# Patient Record
Sex: Female | Born: 1935 | Race: White | Hispanic: No | Smoking: Never smoker
Health system: Southern US, Community
[De-identification: ages and names within clinical notes are randomized; demographics above are authoritative.]

## PROBLEM LIST (undated history)

## (undated) DIAGNOSIS — S4291XA Fracture of right shoulder girdle, part unspecified, initial encounter for closed fracture: Secondary | ICD-10-CM

## (undated) DIAGNOSIS — M1990 Unspecified osteoarthritis, unspecified site: Secondary | ICD-10-CM

## (undated) DIAGNOSIS — C801 Malignant (primary) neoplasm, unspecified: Secondary | ICD-10-CM

## (undated) DIAGNOSIS — M810 Age-related osteoporosis without current pathological fracture: Secondary | ICD-10-CM

## (undated) DIAGNOSIS — I10 Essential (primary) hypertension: Secondary | ICD-10-CM

## (undated) DIAGNOSIS — F0390 Unspecified dementia without behavioral disturbance: Secondary | ICD-10-CM

## (undated) HISTORY — PX: ABDOMINAL HYSTERECTOMY: SHX81

## (undated) HISTORY — PX: APPENDECTOMY: SHX54

---

## 2017-04-15 ENCOUNTER — Emergency Department: Payer: Medicare Other

## 2017-04-15 ENCOUNTER — Encounter: Payer: Self-pay | Admitting: Emergency Medicine

## 2017-04-15 ENCOUNTER — Emergency Department
Admission: EM | Admit: 2017-04-15 | Discharge: 2017-04-15 | Disposition: A | Discharge status: Home / Self Care | Payer: Medicare Other | Attending: Emergency Medicine | Admitting: Emergency Medicine

## 2017-04-15 DIAGNOSIS — I10 Essential (primary) hypertension: Secondary | ICD-10-CM | POA: Diagnosis not present

## 2017-04-15 DIAGNOSIS — R42 Dizziness and giddiness: Secondary | ICD-10-CM | POA: Diagnosis not present

## 2017-04-15 HISTORY — DX: Malignant (primary) neoplasm, unspecified: C80.1

## 2017-04-15 HISTORY — DX: Essential (primary) hypertension: I10

## 2017-04-15 HISTORY — DX: Fracture of right shoulder girdle, part unspecified, initial encounter for closed fracture: S42.91XA

## 2017-04-15 HISTORY — DX: Unspecified osteoarthritis, unspecified site: M19.90

## 2017-04-15 HISTORY — DX: Age-related osteoporosis without current pathological fracture: M81.0

## 2017-04-15 HISTORY — DX: Unspecified dementia, unspecified severity, without behavioral disturbance, psychotic disturbance, mood disturbance, and anxiety: F03.90

## 2017-04-15 LAB — BASIC METABOLIC PANEL
ANION GAP: 6 (ref 5–15)
BUN: 19 mg/dL (ref 6–20)
CO2: 27 mmol/L (ref 22–32)
Calcium: 9.4 mg/dL (ref 8.9–10.3)
Chloride: 107 mmol/L (ref 101–111)
Creatinine, Ser: 1.37 mg/dL — ABNORMAL HIGH (ref 0.44–1.00)
GFR calc Af Amer: 39 mL/min — ABNORMAL LOW (ref >60)
GFR, EST NON AFRICAN AMERICAN: 33 mL/min — AB (ref >60)
Glucose, Bld: 90 mg/dL (ref 65–99)
POTASSIUM: 3.6 mmol/L (ref 3.5–5.1)
SODIUM: 140 mmol/L (ref 135–145)

## 2017-04-15 LAB — CBC
HEMATOCRIT: 39.7 % (ref 35.0–47.0)
HEMOGLOBIN: 13.8 g/dL (ref 12.0–16.0)
MCH: 30.1 pg (ref 26.0–34.0)
MCHC: 34.7 g/dL (ref 32.0–36.0)
MCV: 86.6 fL (ref 80.0–100.0)
Platelets: 163 10*3/uL (ref 150–440)
RBC: 4.58 MIL/uL (ref 3.80–5.20)
RDW: 17.6 % — ABNORMAL HIGH (ref 11.5–14.5)
WBC: 6 10*3/uL (ref 3.6–11.0)

## 2017-04-15 LAB — URINALYSIS, COMPLETE (UACMP) WITH MICROSCOPIC
BACTERIA UA: NONE SEEN
Bilirubin Urine: NEGATIVE
GLUCOSE, UA: NEGATIVE mg/dL
HGB URINE DIPSTICK: NEGATIVE
KETONES UR: NEGATIVE mg/dL
LEUKOCYTES UA: NEGATIVE
NITRITE: NEGATIVE
Protein, ur: NEGATIVE mg/dL
Specific Gravity, Urine: 1.025 (ref 1.005–1.030)
pH: 5 (ref 5.0–8.0)

## 2017-04-15 LAB — GLUCOSE, CAPILLARY: Glucose-Capillary: 87 mg/dL (ref 65–99)

## 2017-04-15 LAB — TROPONIN I: Troponin I: <0.03 ng/mL (ref <0.03)

## 2017-04-15 MED ORDER — LOSARTAN POTASSIUM 100 MG PO TABS: 100.0000 mg | ORAL_TABLET | Freq: Every day | ORAL | 11 refills | Status: AC

## 2017-04-15 NOTE — ED Notes (Signed)
Pt and family very concerned regarding wait time for md. Family has been checking pt's blood pressure approx every 30 minutes to one hour today and presented read out to this rn for review. Pt states she has been dizzy for "a long time every morning". Per pt dizziness increased this am. Pt with perrl 30mm and brisk. Pt moves all extremities without difficulty. Warm blankets provided to pt and explanation of delay provided to family.

## 2017-04-15 NOTE — ED Triage Notes (Signed)
Family to stat desk asking how much longer. Family updated on wait time. Patient in no acute distress at this time.

## 2017-04-15 NOTE — ED Notes (Signed)
Patient transported to CT 

## 2017-04-15 NOTE — ED Provider Notes (Signed)
[facility] Emergency Department Provider Note       Time seen: ----------------------------------------- 9:26 PM on 04/15/2017 -----------------------------------------  Level V caveat: History/ROS limited by dementia   I have reviewed the triage vital signs and the nursing notes.   HISTORY   Chief Complaint Hypertension and Dizziness    HPI Michele Simmons is a 83 y.o. female who presents to the ED for elevated blood pressure. patient was transported to here by her son who is from [place]. Patient typically lives in [place] in the path of the [place] currently. Blood pressure was checked at home today multiple times and was very elevated. It is unknown what her normal blood pressure is. She did have some headache and difficulty walking earlier today. She was taking losartan 50 mg daily and received an additional 25 mg was started today around noon. She denies any recent illness or other complaints but has dementia.   Past Medical History:  Diagnosis Date  . Arthritis   . Cancer ([place])    Basal cell skin cancer  . Dementia   . Hypertension   . Osteoporosis   . Shoulder fracture, right    unable to do surgery due to osteoporosis    There are no active problems to display for this patient.   Past Surgical History:  Procedure Laterality Date  . ABDOMINAL HYSTERECTOMY    . APPENDECTOMY      Allergies Penicillins  Social History Social History  Substance Use Topics  . Smoking status: Never Smoker  . Smokeless tobacco: Never Used  . Alcohol use No    Review of Systems Constitutional: Negative for fever. Cardiovascular: Negative for chest pain. Respiratory: Negative for shortness of breath. Gastrointestinal: Negative for abdominal pain, vomiting and diarrhea. Genitourinary: Negative for dysuria. Musculoskeletal: Negative for back pain. Skin: Negative for rash. Neurological: positive for recent headache  All systems  negative/normal/unremarkable except as stated in the HPI  ____________________________________________   PHYSICAL EXAM:  VITAL SIGNS: ED Triage Vitals  Enc Vitals Group     BP 04/15/17 1701 (!) 156/103     Pulse Rate 04/15/17 1701 89     Resp 04/15/17 1701 18     Temp 04/15/17 1701 98.5 F (36.9 C)     Temp Source 04/15/17 1701 Oral     SpO2 04/15/17 1701 97 %     Weight 04/15/17 1702 140 lb (63.5 kg)     Height 04/15/17 1702 5\' 4"  (1.626 m)     Head Circumference --      Peak Flow --      Pain Score 04/15/17 1700 7     Pain Loc --      Pain Edu? --      Excl. in [place]? --     Constitutional: Alert and oriented. Well appearing and in no distress. Eyes: Conjunctivae are normal. Normal extraocular movements. ENT   Head: Normocephalic and atraumatic.   Nose: No congestion/rhinnorhea.   Mouth/Throat: Mucous membranes are moist.   Neck: No stridor. Cardiovascular: Normal rate, regular rhythm. No murmurs, rubs, or gallops. Respiratory: Normal respiratory effort without tachypnea nor retractions. Breath sounds are clear and equal bilaterally. No wheezes/rales/rhonchi. Gastrointestinal: Soft and nontender. Normal bowel sounds Musculoskeletal: Nontender with normal range of motion in extremities. No lower extremity tenderness nor edema. Neurologic:  Normal speech and language. No gross focal neurologic deficits are appreciated. strength, sensation, cranial nerves appear normal. Skin:  Skin is warm, dry and intact. No rash noted. Psychiatric: Mood  and affect are normal. Speech and behavior are normal.  ____________________________________________  EKG: Interpreted by me.normal sinus rhythm with rate 87 bpm, normal PR interval, normal QRS, normal QT.  ____________________________________________  ED COURSE:  Pertinent labs & imaging results that were available during my care of the patient were reviewed by me and considered in my medical decision making (see chart for  details). Patient presents for hypertension, we will assess with labs and imaging as indicated.   Procedures ____________________________________________   LABS (pertinent positives/negatives)  Labs Reviewed  BASIC METABOLIC PANEL - Abnormal; Notable for the following:       Result Value   Creatinine, Ser 1.37 (*)    GFR calc non Af Amer 33 (*)    GFR calc Af Amer 39 (*)    All other components within normal limits  CBC - Abnormal; Notable for the following:    RDW 17.6 (*)    All other components within normal limits  URINALYSIS, COMPLETE (UACMP) WITH MICROSCOPIC - Abnormal; Notable for the following:    Color, Urine YELLOW (*)    APPearance CLEAR (*)    Squamous Epithelial / LPF 0-5 (*)    All other components within normal limits  GLUCOSE, CAPILLARY  TROPONIN I  CBG MONITORING, ED    RADIOLOGY  CT head IMPRESSION: 1. Cerebral atrophy with moderate degree of chronic appearing small vessel ischemia in the periventricular subcortical white matter. 2. Mild chronic sphenoid sinusitis. 3. No acute intracranial abnormality. ____________________________________________  FINAL ASSESSMENT AND PLAN  hypertension, headache, dizziness  Plan: Patient's labs and imaging were dictated above. Patient had presented for essentially elevated blood pressure. She is asymptomatic currently with a normal neurologic exam. I will advise increased losartan if her blood pressure remains elevated over the next week. Overall she is stable for outpatient follow-up.   Earleen Newport, MD   Note: This note was generated in part or whole with voice recognition software. Voice recognition is usually quite accurate but there are transcription errors that can and very often do occur. I apologize for any typographical errors that were not detected and corrected.     Earleen Newport, MD 04/15/17 [phone]

## 2017-04-15 NOTE — ED Triage Notes (Addendum)
Pt arrived via POV with reports of elevated BP, 761Y systolic and low 073X diastolic.  Pt has been c/o headache and dizziness which she wakes up. Pt c/o headache and dizziness since awakening this morning.  Pt took Losartan 50mg  around 0930 and took another 25mg  of Losartan around 12pm.

## 2018-03-29 IMAGING — CT CT HEAD W/O CM
3 series · 16 of 45 positions shown, 19 images · non-contrast
Comparison: None.

CLINICAL DATA: Hypertension, headache and dizziness

EXAM:
CT HEAD WITHOUT CONTRAST
TECHNIQUE: Contiguous axial images were obtained from the base of the skull
through the vertex without intravenous contrast.

[Series 2: head wo · axial · 0.40mm/px · z∈[-143,-28]mm · 10 of 28 slices shown, 13 images]
[im 3/28  brain]
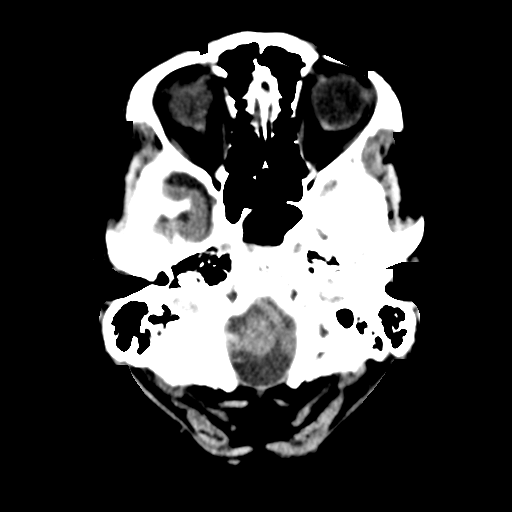
[im 3/28  bone]
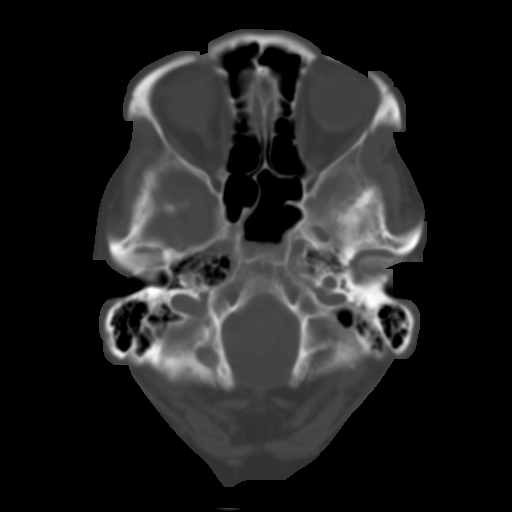
[im 5/28  brain]
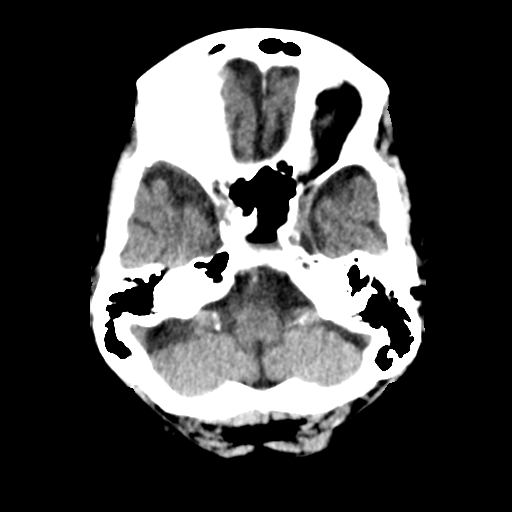
[im 8/28  brain]
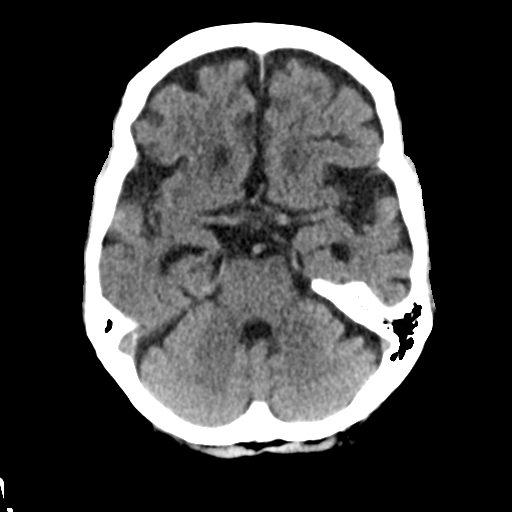
[im 11/28  brain]
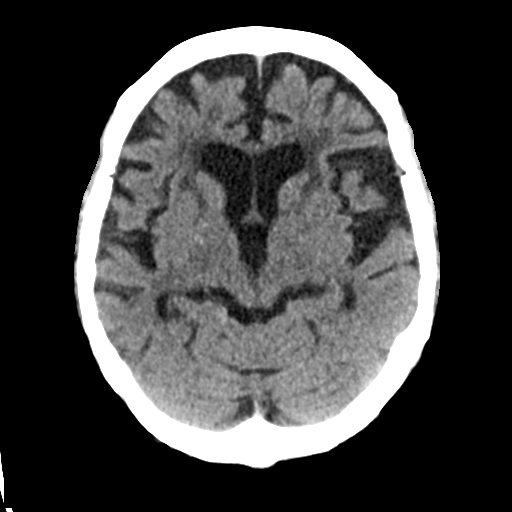
[im 13/28  brain]
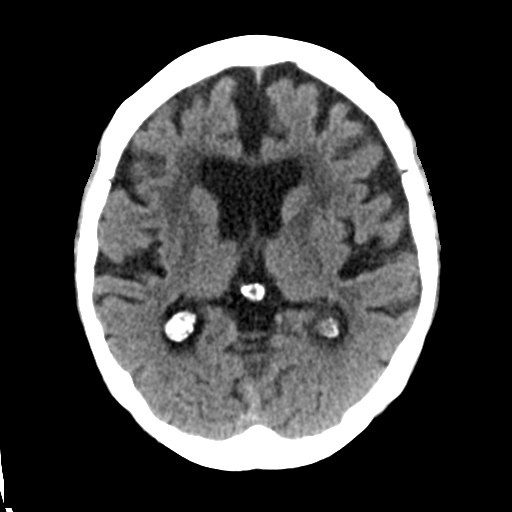
[im 13/28  bone]
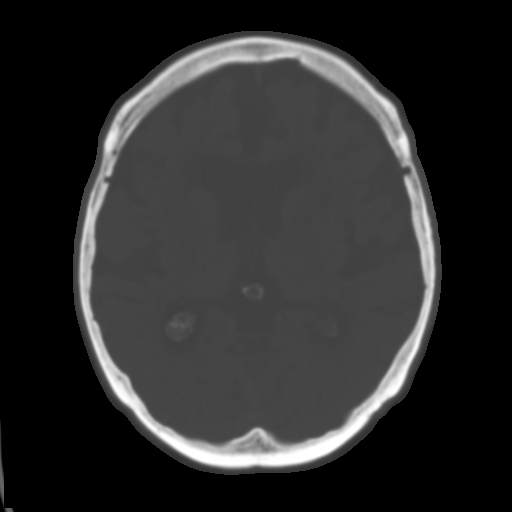
[im 16/28  brain]
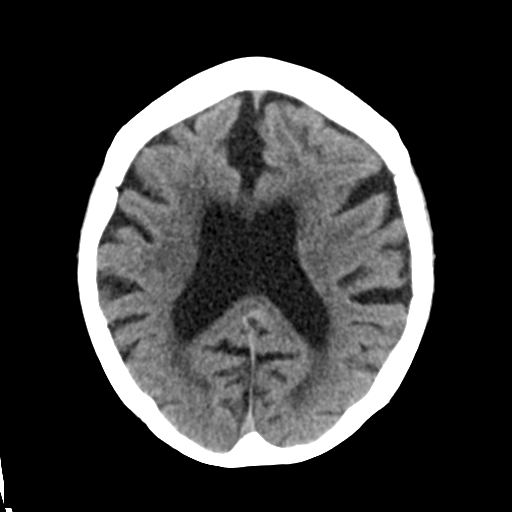
[im 18/28  brain]
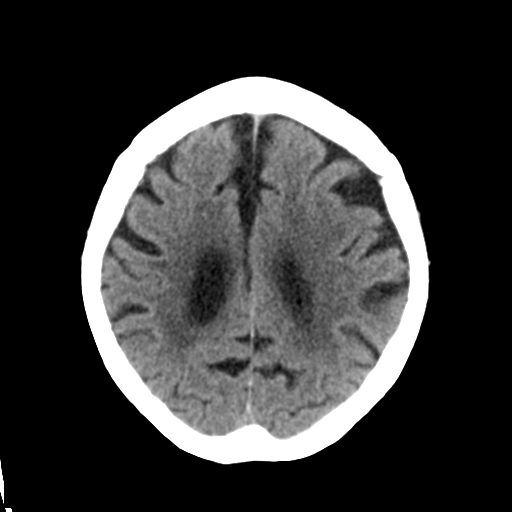
[im 21/28  brain]
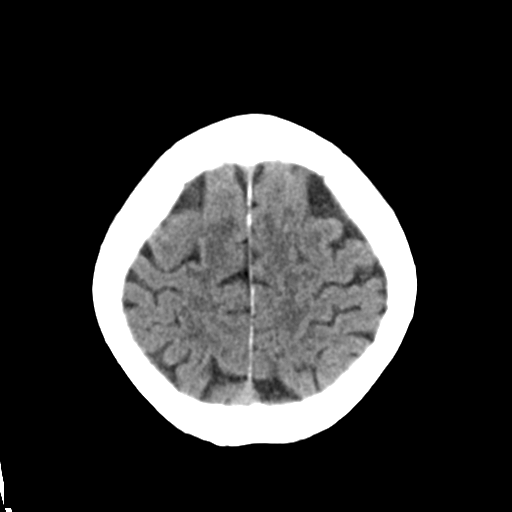
[im 24/28  brain]
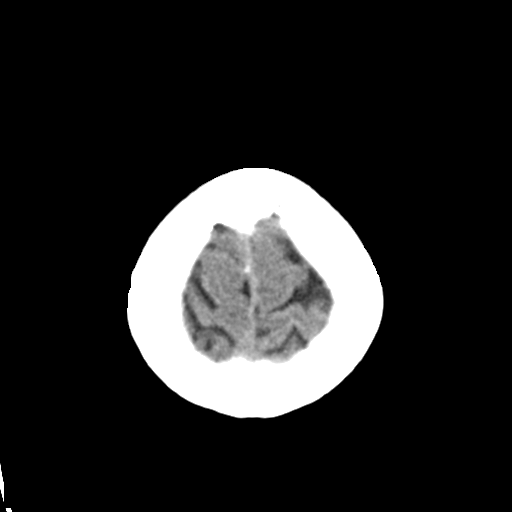
[im 24/28  bone]
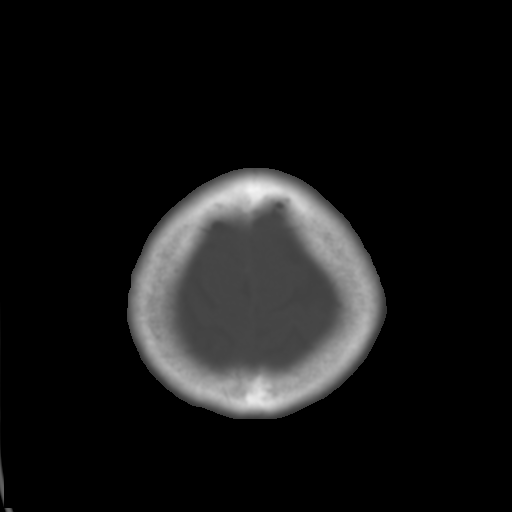
[im 26/28  brain]
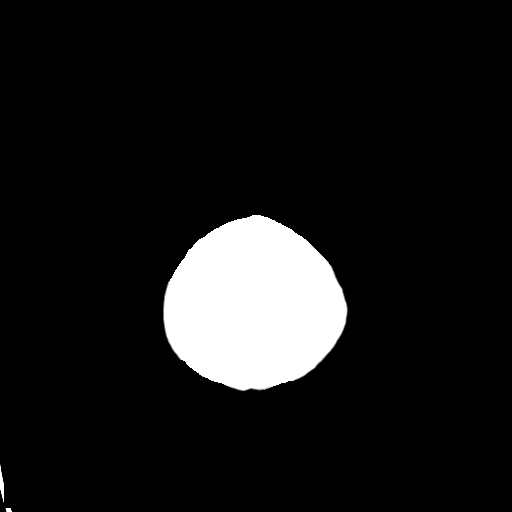

[Series 4: coronal soft tissue · coronal · 0.28mm/px · 3 of 58 slices shown]
[im 20/58  brain]
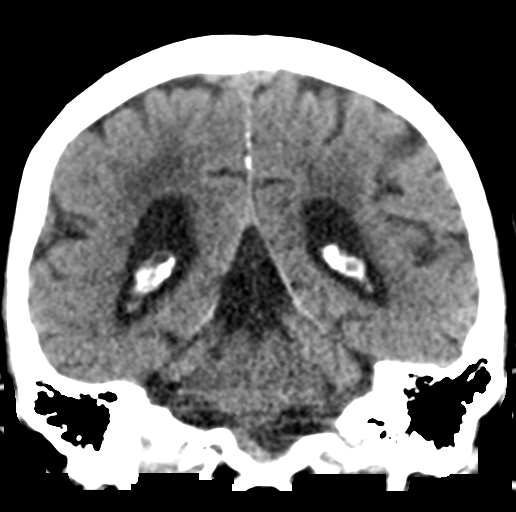
[im 26/58  brain]
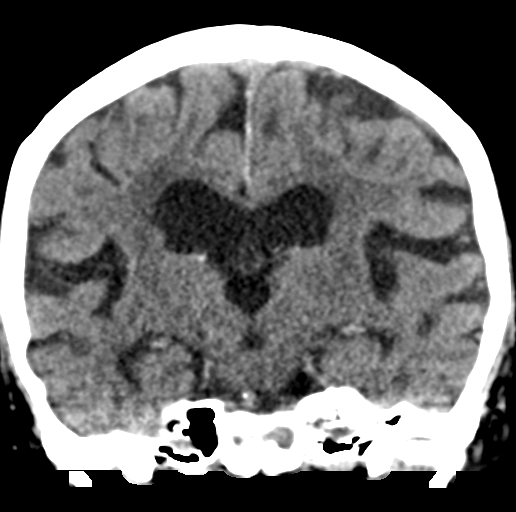
[im 32/58  brain]
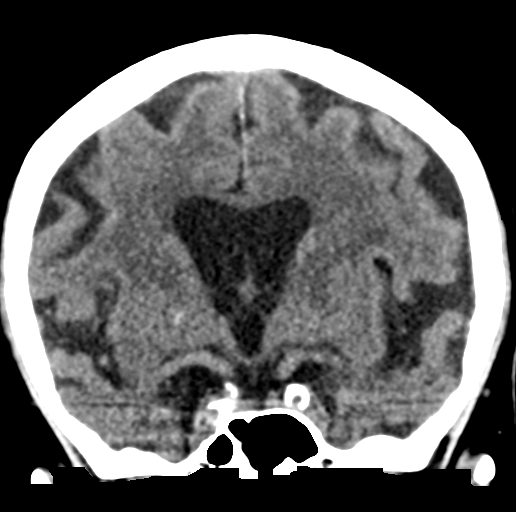

[Series 5: sagittal soft tissue · sagittal · 0.28mm/px · 3 of 49 slices shown]
[im 17/49  brain]
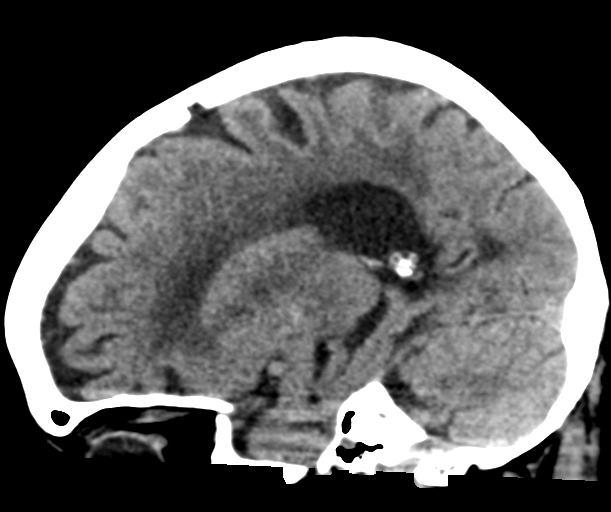
[im 25/49  brain]
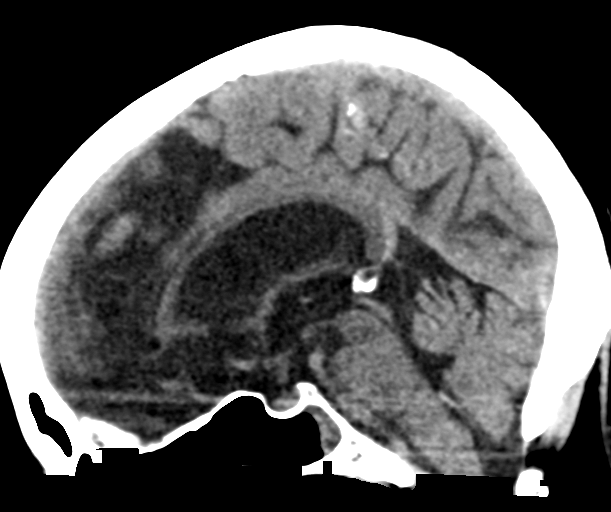
[im 33/49  brain]
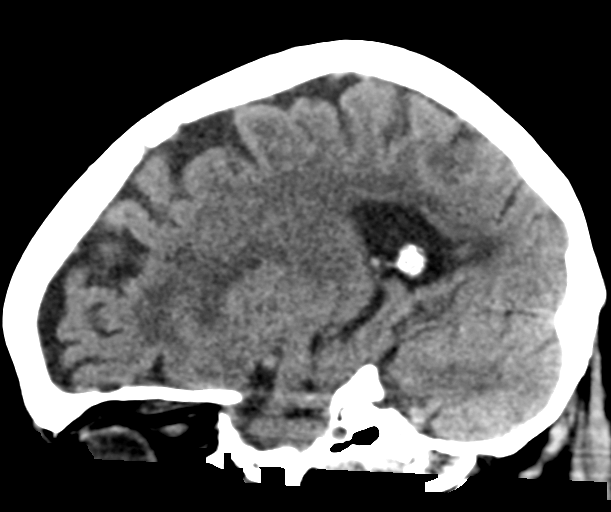

[16 of 45 positions shown; findings below may reference images not displayed]

FINDINGS: BRAIN: There is sulcal and ventricular prominence consistent with
superficial and central atrophy. No intraparenchymal hemorrhage,
mass effect nor midline shift. Periventricular and subcortical white
matter hypodensities consistent with chronic small vessel ischemic
disease are identified. No acute large vascular territory infarcts.
No abnormal extra-axial fluid collections. Basal cisterns are not
effaced and midline.

VASCULAR: Moderate calcific atherosclerosis of the carotid siphons.

SKULL: No skull fracture. No significant scalp soft tissue swelling.

SINUSES/ORBITS: The mastoid air-cells are clear. Mild sphenoid sinus
mucosal thickening posteriorly. Mastoid air cells are clear.

OTHER: None.
IMPRESSION: 1. Cerebral atrophy with moderate degree of chronic appearing small
vessel ischemia in the periventricular subcortical white matter.
2. Mild chronic sphenoid sinusitis.
3. No acute intracranial abnormality.

## 2023-08-02 DEATH — deceased
